# Patient Record
Sex: Female | Born: 1995
Health system: Southern US, Community
[De-identification: ages and names within clinical notes are randomized; demographics above are authoritative.]

## PROBLEM LIST (undated history)

## (undated) DIAGNOSIS — N760 Acute vaginitis: Secondary | ICD-10-CM

## (undated) DIAGNOSIS — B9689 Other specified bacterial agents as the cause of diseases classified elsewhere: Secondary | ICD-10-CM

## (undated) HISTORY — DX: Other specified bacterial agents as the cause of diseases classified elsewhere: B96.89

## (undated) HISTORY — DX: Other specified bacterial agents as the cause of diseases classified elsewhere: N76.0

---
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
## 2014-05-09 ENCOUNTER — Ambulatory Visit: Payer: Self-pay | Admitting: Family Medicine

## 2014-05-10 ENCOUNTER — Ambulatory Visit: Payer: Self-pay | Admitting: Family Medicine

## 2014-08-02 ENCOUNTER — Ambulatory Visit: Admit: 2014-08-02 | Disposition: A | Discharge status: Home / Self Care | Payer: Self-pay | Admitting: Family Medicine

## 2014-08-05 ENCOUNTER — Ambulatory Visit
Admit: 2014-08-05 | Disposition: A | Discharge status: Home / Self Care | Payer: Self-pay | Attending: Family Medicine | Admitting: Family Medicine

## 2015-05-16 DIAGNOSIS — B349 Viral infection, unspecified: Secondary | ICD-10-CM | POA: Diagnosis not present

## 2015-05-16 DIAGNOSIS — J312 Chronic pharyngitis: Secondary | ICD-10-CM | POA: Diagnosis not present

## 2015-09-25 ENCOUNTER — Ambulatory Visit (INDEPENDENT_AMBULATORY_CARE_PROVIDER_SITE_OTHER): Payer: BLUE CROSS/BLUE SHIELD | Admitting: Family Medicine

## 2015-09-25 ENCOUNTER — Encounter: Payer: Self-pay | Admitting: Family Medicine

## 2015-09-25 VITALS — BP 114/69 | HR 87 | Temp 98.6°F | Resp 16

## 2015-09-25 DIAGNOSIS — J029 Acute pharyngitis, unspecified: Secondary | ICD-10-CM | POA: Diagnosis not present

## 2015-09-25 LAB — POCT RAPID STREP A (OFFICE): RAPID STREP A SCREEN: NEGATIVE

## 2015-09-25 MED ORDER — AMOXICILLIN 500 MG PO CAPS: 500.0000 mg | ORAL_CAPSULE | Freq: Two times a day (BID) | ORAL | Status: DC

## 2015-09-25 NOTE — Progress Notes (Signed)
Patient ID: Billey GoslingShaylen Merriott, female   DOB: Jul 17, 1995, 20 y.o.   MRN: [id number]  Presents today with symptoms of sore throat for 1 day. She denies any fever or chills. She does have some postnasal drip. She denies any abdominal pain or fatigue. She is not taking any medications today.  ROS: Negative except mentioned above.  Vitals as on Epic.  GENERAL: NAD HEENT: mild pharyngeal erythema with minimal exudate on right tonsil, no erythema of TMs, no cervical LAD RESP: CTA B CARD: RRR ABD: +BS, NT NEURO: CN II-XII grossly intact   A/P: Pharyngitis- rapid strep test was negative, will treat patient with Amoxicillin, Ibuprofen, Claritin when necessary seek medical attention if symptoms persist or worsen as discussed. I advised the patient that if she has any trouble with physical activity later today she should tell her trainer and stop.

## 2015-12-21 ENCOUNTER — Ambulatory Visit (INDEPENDENT_AMBULATORY_CARE_PROVIDER_SITE_OTHER): Payer: BLUE CROSS/BLUE SHIELD | Admitting: Family Medicine

## 2015-12-21 VITALS — BP 125/76 | HR 93 | Temp 98.3°F | Resp 16

## 2015-12-21 DIAGNOSIS — J069 Acute upper respiratory infection, unspecified: Secondary | ICD-10-CM | POA: Diagnosis not present

## 2015-12-21 NOTE — Progress Notes (Signed)
Patient presents today with symptoms of nasal drainage, sore throat, cough. Patient did have a temperature 101 this morning. Patient states that she's had symptoms for 2 days. She denies any nausea, vomiting, diarrhea, abdominal pain. Last menstrual period 2 weeks ago. Patient take Mucinex and Tylenol earlier today.  ROS: Negative except mentioned above. Vitals as per Epic.  GENERAL: NAD HEENT: mild pharyngeal erythema, no exudate, no erythema of TMs, no cervical LAD RESP: CTA B CARD: RRR NEURO: CN II-XII grossly intact   A/P: URI - Rapid strep. negative , flu screen negative, rest, hydration, Tylenol/Ibuprofen when necessary, Claritin when necessary, Delsym when necessary, seek medical attention if symptoms persist or worsen as discussed. No classic today or athletic activity until afebrile without medication.

## 2016-01-01 ENCOUNTER — Ambulatory Visit (INDEPENDENT_AMBULATORY_CARE_PROVIDER_SITE_OTHER): Payer: BLUE CROSS/BLUE SHIELD | Admitting: Family Medicine

## 2016-01-01 VITALS — BP 127/59 | HR 66 | Temp 98.5°F | Resp 16

## 2016-01-01 DIAGNOSIS — R635 Abnormal weight gain: Secondary | ICD-10-CM | POA: Diagnosis not present

## 2016-01-01 DIAGNOSIS — J069 Acute upper respiratory infection, unspecified: Secondary | ICD-10-CM | POA: Diagnosis not present

## 2016-01-01 MED ORDER — AZITHROMYCIN 250 MG PO TABS: ORAL_TABLET | ORAL | 0 refills | Status: DC

## 2016-01-01 NOTE — Progress Notes (Signed)
Patient presents today with symptoms of mild productive cough. Patient states that she feels better from when she was seen last time but now has colored mucus. She often times is coughing when she is running. She denies any chest pain or increased shortness of breath. She denies any fever or chills or any nausea or vomiting. She is not taking any other medications at this time. Patient also would like to have her thyroid checked. Patient states that she has had weight gain over the postseason and is trying to lose weight but finds it difficult. We have had her see nutrition in the past. She states her greatest weakness is white bread. She denies any thyroid disease in her family. She does believe that her mother is prediabetic.  ROS: Negative except mentioned above.  Vitals as per Epic.  GENERAL: NAD HEENT: no pharyngeal erythema, no exudate, no erythema of TMs, no cervical LAD, no thyroid mass appreciated RESP: CTA B CARD: RRR NEURO: CN II-XII grossly intact   A/P: URI, weight gain - will prescribe Z-Pak, Delsym when necessary, albuterol inhaler when necessary, follow-up when necessary. We will also test TSH and free T4 and A1c. I would like the patient to also see the nutritionist. Patient has agreed to do this. F/U in a few weeks.

## 2016-01-02 LAB — TSH: TSH: 2.26 u[IU]/mL (ref 0.450–4.500)

## 2016-01-02 LAB — HEMOGLOBIN A1C
Est. average glucose Bld gHb Est-mCnc: 103 mg/dL
HEMOGLOBIN A1C: 5.2 % (ref 4.8–5.6)

## 2016-01-02 LAB — T4, FREE: Free T4: 1.4 ng/dL (ref 0.82–1.77)

## 2016-02-02 ENCOUNTER — Ambulatory Visit: Payer: BLUE CROSS/BLUE SHIELD | Admitting: Family Medicine

## 2016-03-25 ENCOUNTER — Ambulatory Visit
Admission: RE | Admit: 2016-03-25 | Discharge: 2016-03-25 | Disposition: A | Discharge status: Home / Self Care | Payer: BLUE CROSS/BLUE SHIELD | Source: Ambulatory Visit | Attending: Family Medicine | Admitting: Family Medicine

## 2016-03-25 ENCOUNTER — Encounter: Payer: Self-pay | Admitting: Family Medicine

## 2016-03-25 ENCOUNTER — Ambulatory Visit (INDEPENDENT_AMBULATORY_CARE_PROVIDER_SITE_OTHER): Payer: BLUE CROSS/BLUE SHIELD | Admitting: Family Medicine

## 2016-03-25 DIAGNOSIS — S59909A Unspecified injury of unspecified elbow, initial encounter: Secondary | ICD-10-CM

## 2016-03-25 DIAGNOSIS — S59902A Unspecified injury of left elbow, initial encounter: Secondary | ICD-10-CM | POA: Diagnosis not present

## 2016-03-25 DIAGNOSIS — M25522 Pain in left elbow: Secondary | ICD-10-CM | POA: Insufficient documentation

## 2016-03-25 DIAGNOSIS — G8929 Other chronic pain: Secondary | ICD-10-CM | POA: Diagnosis not present

## 2016-03-25 MED ORDER — NAPROXEN 500 MG PO TABS: 500.0000 mg | ORAL_TABLET | Freq: Two times a day (BID) | ORAL | 0 refills | Status: DC

## 2016-03-25 NOTE — Progress Notes (Signed)
Patient presents today with symptoms of left elbow pain with some swelling. Patient states that during the basketball game on Saturday she had a valgus injury towards the end of the game. She denies feeling a pop. She did have immediate pain after the injury. She was able to play the rest of the game. Later that evening she informed her trainer about her symptoms. She has some difficulty with flexion but most discomfort with extension. She denies any bruising of the area or any tingling or numbness. She denies any feeling of instability of the elbow. She denies any previous injury to the elbow in the past. It is her nondominant arm. She has been using Game Ready the last 2 days and has had improvement in symptoms.  ROS: Negative except mentioned above. Vitals as per Epic.  GENERAL: NAD MSK: Left elbow- there is no ecchymosis of the area appreciated, 10-15 of limitation with full extension and flexion, there is some mild tenderness of the proximal flexor muscle group, there is mild discomfort with valgus stress, no instability appreciated, no tenderness on lateral or medial epicondyle, discomfort with milking maneuver, NV intact NEURO: CN II-XII grossly intact   A/P: Left medial elbow injury - will do x-rays to look for effusion or soft tissue swelling, no bony injury likely, discussed possibility of UCL strain, continue rest, Game Ready, Naprosyn when necessary, also discussed possibly wearing brace, will decide on further imaging in a few days if symptoms are not improving. Follow up with orthopedics if needed. Discussed plan with patient and trainer.

## 2016-06-19 ENCOUNTER — Other Ambulatory Visit: Payer: Self-pay | Admitting: Family Medicine

## 2016-06-19 MED ORDER — BENZONATATE 100 MG PO CAPS: 100.0000 mg | ORAL_CAPSULE | Freq: Two times a day (BID) | ORAL | 0 refills | Status: DC | PRN

## 2016-06-19 MED ORDER — AZITHROMYCIN 250 MG PO TABS: ORAL_TABLET | ORAL | 0 refills | Status: DC

## 2016-08-07 DIAGNOSIS — H5213 Myopia, bilateral: Secondary | ICD-10-CM | POA: Diagnosis not present

## 2016-09-26 DIAGNOSIS — B349 Viral infection, unspecified: Secondary | ICD-10-CM | POA: Diagnosis not present

## 2016-09-26 DIAGNOSIS — K529 Noninfective gastroenteritis and colitis, unspecified: Secondary | ICD-10-CM | POA: Diagnosis not present

## 2016-10-15 ENCOUNTER — Ambulatory Visit (INDEPENDENT_AMBULATORY_CARE_PROVIDER_SITE_OTHER): Payer: Self-pay | Admitting: Family Medicine

## 2016-10-15 DIAGNOSIS — Z025 Encounter for examination for participation in sport: Secondary | ICD-10-CM

## 2016-10-15 NOTE — Progress Notes (Signed)
Comes in for PPE [place]. Form filled out and copied and will be scanned in. Cipro script given in case patient has moderate to severe diarrhea while in [place]. Will establish with primary care physician when she gets back.

## 2017-09-13 ENCOUNTER — Emergency Department
Admission: EM | Admit: 2017-09-13 | Discharge: 2017-09-13 | Disposition: A | Discharge status: Home / Self Care | Payer: BLUE CROSS/BLUE SHIELD | Attending: Emergency Medicine | Admitting: Emergency Medicine

## 2017-09-13 ENCOUNTER — Encounter: Payer: Self-pay | Admitting: Emergency Medicine

## 2017-09-13 ENCOUNTER — Emergency Department: Payer: BLUE CROSS/BLUE SHIELD

## 2017-09-13 ENCOUNTER — Other Ambulatory Visit: Payer: Self-pay

## 2017-09-13 DIAGNOSIS — R1030 Lower abdominal pain, unspecified: Secondary | ICD-10-CM | POA: Diagnosis not present

## 2017-09-13 DIAGNOSIS — Z79899 Other long term (current) drug therapy: Secondary | ICD-10-CM | POA: Insufficient documentation

## 2017-09-13 DIAGNOSIS — N83202 Unspecified ovarian cyst, left side: Secondary | ICD-10-CM

## 2017-09-13 DIAGNOSIS — B9689 Other specified bacterial agents as the cause of diseases classified elsewhere: Secondary | ICD-10-CM | POA: Diagnosis not present

## 2017-09-13 DIAGNOSIS — R1031 Right lower quadrant pain: Secondary | ICD-10-CM

## 2017-09-13 DIAGNOSIS — N83201 Unspecified ovarian cyst, right side: Secondary | ICD-10-CM

## 2017-09-13 DIAGNOSIS — N760 Acute vaginitis: Secondary | ICD-10-CM | POA: Insufficient documentation

## 2017-09-13 LAB — URINALYSIS, COMPLETE (UACMP) WITH MICROSCOPIC
BILIRUBIN URINE: NEGATIVE
GLUCOSE, UA: NEGATIVE mg/dL
Hgb urine dipstick: NEGATIVE
KETONES UR: NEGATIVE mg/dL
Leukocytes, UA: NEGATIVE
NITRITE: NEGATIVE
PROTEIN: 30 mg/dL — AB
Specific Gravity, Urine: 1.029 (ref 1.005–1.030)
pH: 7 (ref 5.0–8.0)

## 2017-09-13 LAB — WET PREP, GENITAL
Sperm: NONE SEEN
Trich, Wet Prep: NONE SEEN
Yeast Wet Prep HPF POC: NONE SEEN

## 2017-09-13 LAB — COMPREHENSIVE METABOLIC PANEL
ALBUMIN: 4.7 g/dL (ref 3.5–5.0)
ALK PHOS: 41 U/L (ref 38–126)
ALT: 15 U/L (ref 14–54)
ANION GAP: 9 (ref 5–15)
AST: 23 U/L (ref 15–41)
BUN: 12 mg/dL (ref 6–20)
CALCIUM: 8.8 mg/dL — AB (ref 8.9–10.3)
CO2: 22 mmol/L (ref 22–32)
Chloride: 103 mmol/L (ref 101–111)
Creatinine, Ser: 0.91 mg/dL (ref 0.44–1.00)
GFR calc non Af Amer: >60 mL/min (ref >60)
GLUCOSE: 97 mg/dL (ref 65–99)
Potassium: 3.7 mmol/L (ref 3.5–5.1)
SODIUM: 134 mmol/L — AB (ref 135–145)
TOTAL PROTEIN: 7.8 g/dL (ref 6.5–8.1)
Total Bilirubin: 1.4 mg/dL — ABNORMAL HIGH (ref 0.3–1.2)

## 2017-09-13 LAB — CBC
HCT: 40.2 % (ref 35.0–47.0)
HEMOGLOBIN: 13.7 g/dL (ref 12.0–16.0)
MCH: 28.9 pg (ref 26.0–34.0)
MCHC: 34.1 g/dL (ref 32.0–36.0)
MCV: 84.8 fL (ref 80.0–100.0)
Platelets: 238 10*3/uL (ref 150–440)
RBC: 4.74 MIL/uL (ref 3.80–5.20)
RDW: 13.3 % (ref 11.5–14.5)
WBC: 11.7 10*3/uL — ABNORMAL HIGH (ref 3.6–11.0)

## 2017-09-13 LAB — POCT PREGNANCY, URINE: PREG TEST UR: NEGATIVE

## 2017-09-13 LAB — CHLAMYDIA/NGC RT PCR (ARMC ONLY)
CHLAMYDIA TR: NOT DETECTED
N GONORRHOEAE: NOT DETECTED

## 2017-09-13 LAB — LIPASE, BLOOD: Lipase: 29 U/L (ref 11–51)

## 2017-09-13 MED ORDER — MORPHINE SULFATE (PF) 2 MG/ML IV SOLN
2.0000 mg | Freq: Once | INTRAVENOUS | Status: AC
  Administered 2017-09-13: 2 mg via INTRAVENOUS
  Filled 2017-09-13: qty 1

## 2017-09-13 MED ORDER — METRONIDAZOLE 500 MG PO TABS: 500.0000 mg | ORAL_TABLET | Freq: Three times a day (TID) | ORAL | 0 refills | Status: DC

## 2017-09-13 MED ORDER — IOPAMIDOL (ISOVUE-300) INJECTION 61%
100.0000 mL | Freq: Once | INTRAVENOUS | Status: AC | PRN
  Administered 2017-09-13: 100 mL via INTRAVENOUS

## 2017-09-13 MED ORDER — SODIUM CHLORIDE 0.9 % IV BOLUS
1000.0000 mL | Freq: Once | INTRAVENOUS | Status: AC
  Administered 2017-09-13: 1000 mL via INTRAVENOUS

## 2017-09-13 MED ORDER — KETOROLAC TROMETHAMINE 30 MG/ML IJ SOLN
15.0000 mg | Freq: Once | INTRAMUSCULAR | Status: AC
  Administered 2017-09-13: 15 mg via INTRAVENOUS
  Filled 2017-09-13: qty 1

## 2017-09-13 MED ORDER — ONDANSETRON HCL 4 MG/2ML IJ SOLN
4.0000 mg | Freq: Once | INTRAMUSCULAR | Status: AC
  Administered 2017-09-13: 4 mg via INTRAVENOUS
  Filled 2017-09-13: qty 2

## 2017-09-13 MED ORDER — IOPAMIDOL (ISOVUE-300) INJECTION 61%
30.0000 mL | Freq: Once | INTRAVENOUS | Status: AC
  Administered 2017-09-13: 30 mL via ORAL

## 2017-09-13 MED ORDER — ONDANSETRON 4 MG PO TBDP: 4.0000 mg | ORAL_TABLET | Freq: Four times a day (QID) | ORAL | 0 refills | Status: DC | PRN

## 2017-09-13 NOTE — ED Triage Notes (Signed)
Arrives C/O lower abdominal pain that radiates toward low back.  Also c/o N/V.  States symptom onset was 1000.  AAOx3.  Skin warm and dry. NAD

## 2017-09-13 NOTE — Discharge Instructions (Addendum)
You were seen in the emergency room for abdominal pain. It is important that you follow up closely with your primary care doctor in the next couple of days. ° ° °Please return to the emergency room right away if you are to develop a fever, severe nausea, your pain becomes severe or worsens, you are unable to keep food down, begin vomiting any dark or bloody fluid, you develop any dark or bloody stools, feel dehydrated, or other new concerns or symptoms arise. ° ° °

## 2017-09-13 NOTE — ED Provider Notes (Signed)
-----------------------------------------   9:41 PM on 09/13/2017 -----------------------------------------  IMPRESSION: No acute abnormality identified in the abdomen.  No bowel obstruction. The appendix is not seen but no inflammation is noted around cecum.  Small amount of free fluid is identified in the pelvis. Bilateral ovarian cysts, right greater than left, probably follicular cysts.  Reviewed CT results, there is a roughly 3.6 x 7 cm probably right ovarian complicated cyst noted on CT read.  Based upon this and the patient's pain in the right lower quadrant without evidence of appendicitis on CT scan I am elevated concern for ovarian torsion.  Even with a normal ovarian ultrasound, if negative for torsion would still advise OB consult given the nature of pain and location of her symptoms.  I have placed a consult to ob/gynecology Dr. Jean RosenthalJackson via phone call at 950p  Updated Scotty CourtStafford on plan for OB/GYN consultation concerning right lower quadrant pain.   1010pm, Dr. Jean RosenthalJackson in ED seeing patient.    Sharyn CreamerQuale, Mark, MD 09/13/17 2210

## 2017-09-13 NOTE — Consult Note (Signed)
GYNECOLOGY CONSULT NOTE  GYN Consultation  Attending Provider: Sharyn Creamer, MD   Mikela Senn [phone] 09/13/2017 11:13 PM    Reason for Consultation:   Julia Rogers is a 22 y.o. G0 female seen at the request of Dr. Fanny Bien for evaluation of right lower quadrant pain, concern for ovarian torsion.    History of Present Ilness:   22 y.o. G0 female who presents with acute-onset right lower quadrant pain that started this morning at about 9 am.  The pain has been sharp and intermittent. The pain is 8/10 at its worst and 5/10 when not as bad. The pain radiates through to her back and down her posterior thigh.  Lying still makes the pain better.  Moving around makes it worse.  She associates nausea and emesis with her pain. She has not been able to keep any food down at all today. She denies fevers, chills, chest pain, shortness of breath, diarrhea, constipation, urinary systems, vaginal symptoms.  LMP was about two weeks ago. She has not been sexually active in about 9 months and is not using anything for contraception or menstrual manipulation.  Denies history of STDs.   Past Medical History: denies  Past Surgical History: no prior surgeries  Allergies: NKDA  Medications: denies any current medication   Obstetric History: She is a G0  Gynecologic History:   Menarche age: 53 Patient's last menstrual period was 08/30/2017. Cycle Hx: regular every 28 days without intermenstrual spotting She denies STDs Contraception: denies   Social History:  She  reports that she has never smoked. She does not have any smokeless tobacco history on file. She reports that she does not drink alcohol.  Family History:  family history includes Cancer in her maternal grandmother; Diabetes in her mother and paternal grandmother; Hypertension in her paternal grandmother.   Review of Systems  Constitutional: Negative.   HENT: Negative.   Eyes: Negative.   Respiratory: Negative.   Cardiovascular:  Negative.   Gastrointestinal: Positive for abdominal pain, nausea and vomiting. Negative for blood in stool, constipation, diarrhea, heartburn and melena.  Genitourinary: Negative.   Musculoskeletal: Negative.   Skin: Negative.   Neurological: Negative.   Endo/Heme/Allergies: Negative.   Psychiatric/Behavioral: Negative.      Objective    BP 124/73   Pulse 86   Temp 98 F (36.7 C) (Oral)   Resp 18   Ht 5\' 8"  (1.727 m)   Wt 205 lb (93 kg)   LMP 08/30/2017   SpO2 100%   BMI 31.17 kg/m  Physical Exam  Physical Exam  Constitutional: She is oriented to person, place, and time. She appears well-developed and well-nourished. She does not appear ill. No distress.  HENT:  Head: Normocephalic and atraumatic.  Eyes: Conjunctivae and EOM are normal. No scleral icterus.  Neck: Normal range of motion. Neck supple. No thyromegaly present.  Cardiovascular: Normal rate and regular rhythm.  Pulmonary/Chest: Effort normal and breath sounds normal. No respiratory distress. She has no wheezes.  Abdominal: Soft. Normal appearance and bowel sounds are normal. She exhibits no distension. There is tenderness (mild RLQ) in the right lower quadrant. There is no rigidity, no rebound, no guarding, no CVA tenderness, no tenderness at McBurney's point and negative Murphy's sign.  Musculoskeletal: Normal range of motion. She exhibits no edema.  Lymphadenopathy:    She has no cervical adenopathy.  Neurological: She is alert and oriented to person, place, and time. No cranial nerve deficit.  Skin: Skin is warm and dry. She is  not diaphoretic. No pallor.  Psychiatric: She has a normal mood and affect. Her behavior is normal. Judgment normal.     Laboratory Results:   Lab Results  Component Value Date   WBC 11.7 (H) 09/13/2017   RBC 4.74 09/13/2017   HGB 13.7 09/13/2017   HCT 40.2 09/13/2017   PLT 238 09/13/2017   NA 134 (L) 09/13/2017   K 3.7 09/13/2017   CREATININE 0.91 09/13/2017   Lab Results    Component Value Date   PREGTESTUR NEGATIVE 09/13/2017   [place] Transvaginal Non-ob  Result Date: 09/13/2017 CLINICAL DATA:  Initial evaluation for acute sharp right lower quadrant pain. EXAM: TRANSABDOMINAL AND TRANSVAGINAL ULTRASOUND OF PELVIS DOPPLER ULTRASOUND OF OVARIES TECHNIQUE: Both transabdominal and transvaginal ultrasound examinations of the pelvis were performed. Transabdominal technique was performed for global imaging of the pelvis including uterus, ovaries, adnexal regions, and pelvic cul-de-sac. It was necessary to proceed with endovaginal exam following the transabdominal exam to visualize the uterus, endometrium, and ovaries. Color and duplex Doppler ultrasound was utilized to evaluate blood flow to the ovaries. COMPARISON:  Prior CT from earlier the same day. FINDINGS: Uterus Measurements: 7.5 x 4.7 x 5.9 cm. No fibroids or other mass visualized. Endometrium Thickness: 13.3 mm.  No focal abnormality visualized. Right ovary Measurements: 5.1 x 2.7 x 4.0 cm.  2.0 cm dominant follicle noted. Left ovary Measurements: 3.8 x 2.5 x 2.7 cm. Normal appearance/no adnexal mass. Pulsed Doppler evaluation of both ovaries demonstrates normal low-resistance arterial and venous waveforms. Other findings No abnormal free fluid. IMPRESSION: Normal pelvic ultrasound. No acute abnormality identified. No evidence for torsion. Electronically Signed   By: Rise MuBenjamin  McClintock M.D.   On: 09/13/2017 22:24   [place] Pelvis Complete  Result Date: 09/13/2017 CLINICAL DATA:  Initial evaluation for acute sharp right lower quadrant pain. EXAM: TRANSABDOMINAL AND TRANSVAGINAL ULTRASOUND OF PELVIS DOPPLER ULTRASOUND OF OVARIES TECHNIQUE: Both transabdominal and transvaginal ultrasound examinations of the pelvis were performed. Transabdominal technique was performed for global imaging of the pelvis including uterus, ovaries, adnexal regions, and pelvic cul-de-sac. It was necessary to proceed with endovaginal exam following the  transabdominal exam to visualize the uterus, endometrium, and ovaries. Color and duplex Doppler ultrasound was utilized to evaluate blood flow to the ovaries. COMPARISON:  Prior CT from earlier the same day. FINDINGS: Uterus Measurements: 7.5 x 4.7 x 5.9 cm. No fibroids or other mass visualized. Endometrium Thickness: 13.3 mm.  No focal abnormality visualized. Right ovary Measurements: 5.1 x 2.7 x 4.0 cm.  2.0 cm dominant follicle noted. Left ovary Measurements: 3.8 x 2.5 x 2.7 cm. Normal appearance/no adnexal mass. Pulsed Doppler evaluation of both ovaries demonstrates normal low-resistance arterial and venous waveforms. Other findings No abnormal free fluid. IMPRESSION: Normal pelvic ultrasound. No acute abnormality identified. No evidence for torsion. Electronically Signed   By: Rise MuBenjamin  McClintock M.D.   On: 09/13/2017 22:24   Ct Abdomen Pelvis W Contrast  Result Date: 09/13/2017 CLINICAL DATA:  Lower abdomen pain. EXAM: CT ABDOMEN AND PELVIS WITH CONTRAST TECHNIQUE: Multidetector CT imaging of the abdomen and pelvis was performed using the standard protocol following bolus administration of intravenous contrast. CONTRAST:  100mL ISOVUE-300 IOPAMIDOL (ISOVUE-300) INJECTION 61% COMPARISON:  None. FINDINGS: Lower chest: No acute abnormality. Hepatobiliary: Diffuse low density of the liver. There is no focal liver mass. The gallbladder and biliary tree are normal. Pancreas: Unremarkable. No pancreatic ductal dilatation or surrounding inflammatory changes. Spleen: Normal in size without focal abnormality. Adrenals/Urinary Tract: Adrenal glands are unremarkable. Kidneys are  normal, without renal calculi, focal lesion, or hydronephrosis. Bladder is unremarkable. Stomach/Bowel: There is a small hiatal hernia. The stomach is otherwise normal. There is no small bowel obstruction. The appendix is not seen but no inflammation is noted around cecum. Colon is normal. Vascular/Lymphatic: No significant vascular findings  are present. No enlarged abdominal or pelvic lymph nodes. Reproductive: Mild free fluid is identified in the pelvis. Complicated cystic area is identified in the right adnexa measuring 3.6 x 7.6 cm probably right ovary with multiple cysts. The uterus is normal. Smaller cysts are identified in the left ovary. Other: None. Musculoskeletal: No acute or significant osseous findings. IMPRESSION: No acute abnormality identified in the abdomen. No bowel obstruction. The appendix is not seen but no inflammation is noted around cecum. Small amount of free fluid is identified in the pelvis. Bilateral ovarian cysts, right greater than left, probably follicular cysts. Electronically Signed   By: Sherian Rein M.D.   On: 09/13/2017 21:31   [place] Art/ven Flow Abd Pelv Doppler  Result Date: 09/13/2017 CLINICAL DATA:  Initial evaluation for acute sharp right lower quadrant pain. EXAM: TRANSABDOMINAL AND TRANSVAGINAL ULTRASOUND OF PELVIS DOPPLER ULTRASOUND OF OVARIES TECHNIQUE: Both transabdominal and transvaginal ultrasound examinations of the pelvis were performed. Transabdominal technique was performed for global imaging of the pelvis including uterus, ovaries, adnexal regions, and pelvic cul-de-sac. It was necessary to proceed with endovaginal exam following the transabdominal exam to visualize the uterus, endometrium, and ovaries. Color and duplex Doppler ultrasound was utilized to evaluate blood flow to the ovaries. COMPARISON:  Prior CT from earlier the same day. FINDINGS: Uterus Measurements: 7.5 x 4.7 x 5.9 cm. No fibroids or other mass visualized. Endometrium Thickness: 13.3 mm.  No focal abnormality visualized. Right ovary Measurements: 5.1 x 2.7 x 4.0 cm.  2.0 cm dominant follicle noted. Left ovary Measurements: 3.8 x 2.5 x 2.7 cm. Normal appearance/no adnexal mass. Pulsed Doppler evaluation of both ovaries demonstrates normal low-resistance arterial and venous waveforms. Other findings No abnormal free fluid.  IMPRESSION: Normal pelvic ultrasound. No acute abnormality identified. No evidence for torsion. Electronically Signed   By: Rise Mu M.D.   On: 09/13/2017 22:24    Assessment & Recommendations   Lashawna Poche is a 22 y.o. G0 female with right lower quadrant pain being seen in consultation.  Concern for ovarian torsion. I had a long discussion with the patient regarding the risks of ovarian torsion, including loss of an ovary and other possible sequelae.  Given symptom constellation and ultrasound/CT findings, I recommended a diagnostic laparoscopy.  After much discussion of pros and cons along with risks and benefits, the patient has elected to hold off on surgery for now. I offered to admit her for observation, which she also declined. She states that she is feeling better now and elects to go home. I was clear with her regarding her risks of not having surgery and welcomed her to come back should any concerning symptoms recur.  Her parents were present with her and all questions from her and her parents were answered.   Thomasene Mohair, MD 09/13/2017 11:13 PM

## 2017-09-13 NOTE — ED Triage Notes (Signed)
Lower R abdominal pain began this am.

## 2017-09-13 NOTE — ED Provider Notes (Signed)
 -----------------------------------------   11:28 PM on 09/13/2017 -----------------------------------------  Discussed with Dr. Jean RosenthalJackson after his evaluation of the patient.  He notes that he recommended diagnostic laparoscopy due to concern for occult torsion, but patient refused.  He also offered observation for symptom monitoring and serial exams but she refused and wants to go home.  She is feeling much better after Toradol and a low dose of morphine.  I will discharge her with prescription for Flagyl for treatment of bacterial vaginosis.  Return precautions.  Overall imaging does not reveal any definitive serious concerns, and she has medical decision-making capacity.   Sharman CheekStafford, Garek Schuneman, MD 09/13/17 2330

## 2017-09-13 NOTE — ED Provider Notes (Addendum)
[facility] Department Provider Note   ____________________________________________   First MD Initiated Contact with Patient 09/13/17 1939     (approximate)  I have reviewed the triage vital signs and the nursing notes.   HISTORY  Chief Complaint Abdominal Pain    HPI Julia Rogers is a 22 y.o. female presents for evaluation of right lower abdominal pain.  Patient reports that roughly 9 or 10 AM this morning is been experiencing a severe pain in the right lower abdomen that radiates slightly towards her right lower pelvis.  Pain is hard to describe somewhat like a pressure or sharpness.  Not associate any vaginal bleeding or discharge.  Last menstrual cycle 2 weeks ago.  Is not been sexually active for about 9 months.  Denies any history of STDs.  Pain is somewhat intermittent in nature, seems to come and go when it does the pain is severe in the lower pelvis associated with vomiting.  No fevers or chills.  Does report nausea is vomited several times due to the pain today.  No history of any ovarian cysts.  History reviewed. No pertinent past medical history.  There are no active problems to display for this patient.   History reviewed. No pertinent surgical history.  Prior to Admission medications   Medication Sig Start Date End Date Taking? Authorizing Provider  azithromycin (ZITHROMAX Z-PAK) 250 MG tablet Use as directed on box for 5 days. 06/19/16   Jolene Provost, MD  benzonatate (TESSALON) 100 MG capsule Take 1 capsule (100 mg total) by mouth 2 (two) times daily as needed for cough. 06/19/16   Jolene Provost, MD  metroNIDAZOLE (FLAGYL) 500 MG tablet Take 1 tablet (500 mg total) by mouth 3 (three) times daily. 09/13/17   Sharyn Creamer, MD  naproxen (NAPROSYN) 500 MG tablet Take 1 tablet (500 mg total) by mouth 2 (two) times daily with a meal. 03/25/16   Jolene Provost, MD  ondansetron (ZOFRAN ODT) 4 MG disintegrating tablet Take 1 tablet  (4 mg total) by mouth every 6 (six) hours as needed for nausea or vomiting. 09/13/17   Sharyn Creamer, MD    Allergies Patient has no known allergies.  Family History  Problem Relation Age of Onset  . Diabetes Mother   . Cancer Maternal Grandmother   . Hypertension Paternal Grandmother   . Diabetes Paternal Grandmother     Social History Social History   Tobacco Use  . Smoking status: Never Smoker  Substance Use Topics  . Alcohol use: No  . Drug use: Not on file    Review of Systems Constitutional: No fever/chills Eyes: No visual changes. ENT: No sore throat. Cardiovascular: Denies chest pain. Respiratory: Denies shortness of breath. Gastrointestinal:  No diarrhea.  No constipation. Genitourinary: Negative for dysuria. Musculoskeletal: Negative for back pain. Skin: Negative for rash. Neurological: Negative for headaches, focal weakness or numbness.    ____________________________________________   PHYSICAL EXAM:  VITAL SIGNS: ED Triage Vitals  Enc Vitals Group     BP 09/13/17 1522 124/73     Pulse Rate 09/13/17 1522 86     Resp 09/13/17 1522 18     Temp 09/13/17 1522 98 F (36.7 C)     Temp Source 09/13/17 1522 Oral     SpO2 09/13/17 1522 100 %     Weight 09/13/17 1523 205 lb (93 kg)     Height 09/13/17 1523 5\' 8"  (1.727 m)     Head Circumference --  Peak Flow --      Pain Score 09/13/17 1523 8     Pain Loc --      Pain Edu? --      Excl. in GC? --     Constitutional: Alert and oriented. Well appearing and in no acute distress. Eyes: Conjunctivae are normal. Head: Atraumatic. Nose: No congestion/rhinnorhea. Mouth/Throat: Mucous membranes are moist. Neck: No stridor.   Cardiovascular: Normal rate, regular rhythm. Grossly normal heart sounds.  Good peripheral circulation. Respiratory: Normal respiratory effort.  No retractions. Lungs CTAB. Gastrointestinal: Soft and nontender on the left side, but the patient has notable tenderness in the right  lower quadrant with some slight rebound tenderness.  The pain is in the area of McBurney's point. No distention. Pelvic exam performed with tech Brittney.  External exam is normal.  Internal exam demonstrates there is scant somewhat thickened and slightly greenish discharge.  No cervical motion tenderness.  The patient does report some tenderness increasing in the right lower quadrant to examination in the right adnexa. Musculoskeletal: No lower extremity tenderness nor edema. Neurologic:  Normal speech and language. No gross focal neurologic deficits are appreciated.  Skin:  Skin is warm, dry and intact. No rash noted. Psychiatric: Mood and affect are normal. Speech and behavior are normal.  ____________________________________________   LABS (all labs ordered are listed, but only abnormal results are displayed)  Labs Reviewed  WET PREP, GENITAL - Abnormal; Notable for the following components:      Result Value   Clue Cells Wet Prep HPF POC PRESENT (*)    WBC, Wet Prep HPF POC MODERATE (*)    All other components within normal limits  COMPREHENSIVE METABOLIC PANEL - Abnormal; Notable for the following components:   Sodium 134 (*)    Calcium 8.8 (*)    Total Bilirubin 1.4 (*)    All other components within normal limits  CBC - Abnormal; Notable for the following components:   WBC 11.7 (*)    All other components within normal limits  URINALYSIS, COMPLETE (UACMP) WITH MICROSCOPIC - Abnormal; Notable for the following components:   Color, Urine YELLOW (*)    APPearance CLOUDY (*)    Protein, ur 30 (*)    Bacteria, UA RARE (*)    All other components within normal limits  URINE CULTURE  CHLAMYDIA/NGC RT PCR ([facility] ONLY)  LIPASE, BLOOD  POCT PREGNANCY, URINE  POC URINE PREG, ED   ____________________________________________  EKG   ____________________________________________  RADIOLOGY  CT abdomen pelvis and ultrasound to exclude ovarian torsion pending at the  time of signout to Dr. Scotty CourtStafford ____________________________________________   PROCEDURES  Procedure(s) performed: None  Procedures  Critical Care performed: No  ____________________________________________   INITIAL IMPRESSION / ASSESSMENT AND PLAN / ED COURSE  Pertinent labs & imaging results that were available during my care of the patient were reviewed by me and considered in my medical decision making (see chart for details).  Differential diagnosis includes but is not limited to, abdominal perforation, aortic dissection, cholecystitis, appendicitis, diverticulitis, colitis, esophagitis/gastritis, kidney stone, pyelonephritis, urinary tract infection, aortic aneurysm. All are considered in decision and treatment plan. Based upon the patient's presentation and risk factors, concerned about right lower quadrant pathology the etiology is not clear but high in her differentials exclusion of appendicitis, torsion, consideration for hemorrhagic cyst.  Does not appear to have any obvious infectious symptoms or exam suggests she may have an element of bacterial vaginosis.  Does not appear to be at high  risk for an STD without a sexually active for several months.   Clinical Course as of Sep 13 2136  Sat Sep 13, 2017  2133 CT ABDOMEN PELVIS W CONTRAST [MQ]  2133 Ongoing care and disposition assigned to Dr. Scotty Court. Dr. Scotty Court to follow-up on CT abdomen pelvis and ultrasound.  Plan to exclude right lower quadrant pathology including appendicitis, ovarian torsion, hemorrhagic cyst, and other acute pathology.  Follow-up on ordered studies and reassessment.   [MQ]    Clinical Course User Index [MQ] Sharyn Creamer, MD   After receiving morphine and Toradol the patient reports her pain is much better.  She will be going shortly for ultrasound and CT to further evaluate.  ____________________________________________   FINAL CLINICAL IMPRESSION(S) / ED DIAGNOSES  Final diagnoses:    Bacterial vaginitis  Lower abdominal pain      NEW MEDICATIONS STARTED DURING THIS VISIT:  New Prescriptions   METRONIDAZOLE (FLAGYL) 500 MG TABLET    Take 1 tablet (500 mg total) by mouth 3 (three) times daily.   ONDANSETRON (ZOFRAN ODT) 4 MG DISINTEGRATING TABLET    Take 1 tablet (4 mg total) by mouth every 6 (six) hours as needed for nausea or vomiting.     Note:  This document was prepared using Dragon voice recognition software and may include unintentional dictation errors.     Sharyn Creamer, MD 09/13/17 2139    Sharyn Creamer, MD 09/13/17 2200

## 2017-09-15 LAB — URINE CULTURE
CULTURE: NO GROWTH
SPECIAL REQUESTS: NORMAL

## 2017-10-23 ENCOUNTER — Ambulatory Visit (INDEPENDENT_AMBULATORY_CARE_PROVIDER_SITE_OTHER): Payer: BLUE CROSS/BLUE SHIELD | Admitting: Obstetrics and Gynecology

## 2017-10-23 ENCOUNTER — Encounter: Payer: Self-pay | Admitting: Obstetrics and Gynecology

## 2017-10-23 ENCOUNTER — Ambulatory Visit: Payer: Self-pay | Admitting: Obstetrics and Gynecology

## 2017-10-23 VITALS — BP 110/70 | HR 88 | Ht 69.0 in | Wt 222.0 lb

## 2017-10-23 DIAGNOSIS — N83201 Unspecified ovarian cyst, right side: Secondary | ICD-10-CM

## 2017-10-23 DIAGNOSIS — N940 Mittelschmerz: Secondary | ICD-10-CM | POA: Diagnosis not present

## 2017-10-23 DIAGNOSIS — R102 Pelvic and perineal pain: Secondary | ICD-10-CM | POA: Diagnosis not present

## 2017-10-23 NOTE — Progress Notes (Signed)
Patient ID: Julia Rogers, female   DOB: June 25, 1995, 22 y.o.   MRN: [id number]  Reason for Consult: ER follow up (Possible Right ovarian cyst)   Referred by Jolene Provost, MD  Subjective:     HPI:  Julia Rogers is a 22 y.o. female she is following up from the ER today.  She was seen in the ER on 09/13/2017 by Dr. Jean Rosenthal.  She was offered a diagnostic laparoscopy for possible ovarian torsion related to severe right-sided pelvic pain.  The patient had a normal ultrasound which showed a 2 cm right ovarian cyst fluid was detected in both ovaries.  The patient elected not to have surgery.  Her pain was controlled with Toradol and morphine.  Since that ER visit she reports that she has had no further pelvic pain.  She reports that the pain is better the next day.  She is present at today's visit with her mother Julia Rogers.    History reviewed. No pertinent past medical history. Family History  Problem Relation Age of Onset  . Diabetes Mother   . Cancer Maternal Grandmother   . Hypertension Paternal Grandmother   . Diabetes Paternal Grandmother    History reviewed. No pertinent surgical history.  Short Social History:  Social History   Tobacco Use  . Smoking status: Never Smoker  . Smokeless tobacco: Never Used  Substance Use Topics  . Alcohol use: No    No Known Allergies  No current outpatient medications on file.   No current facility-administered medications for this visit.     Review of Systems  Constitutional: Negative for chills, fatigue, fever and unexpected weight change.  HENT: Negative for trouble swallowing.  Eyes: Negative for loss of vision.  Respiratory: Negative for cough, shortness of breath and wheezing.  Cardiovascular: Negative for chest pain, leg swelling, palpitations and syncope.  GI: Negative for abdominal pain, blood in stool, diarrhea, nausea and vomiting.  GU: Negative for difficulty urinating, dysuria, frequency and hematuria.  Musculoskeletal:  Negative for back pain, leg pain and joint pain.  Skin: Negative for rash.  Neurological: Negative for dizziness, headaches, light-headedness, numbness and seizures.  Psychiatric: Negative for behavioral problem, confusion, depressed mood and sleep disturbance.        Objective:  Objective   Vitals:   10/23/17 0807  BP: 110/70  Pulse: 88  Weight: 222 lb (100.7 kg)  Height: 5\' 9"  (1.753 m)   Body mass index is 32.78 kg/m.  Physical Exam  Constitutional: She is oriented to person, place, and time. She appears well-developed and well-nourished.  HENT:  Head: Normocephalic and atraumatic.  Eyes: Pupils are equal, round, and reactive to light. EOM are normal.  Cardiovascular: Normal rate and regular rhythm.  Pulmonary/Chest: Effort normal. No respiratory distress.  Neurological: She is alert and oriented to person, place, and time.  Skin: Skin is warm and dry.  Psychiatric: She has a normal mood and affect. Her behavior is normal. Judgment and thought content normal.  Nursing note and vitals reviewed.      Assessme she is doing things as he was 5 06/14/1878 pregnant rightnt/Plan:     22 yo following up for acute pelvic pain We discussed that if the pain were to return she could treat herself with 600 mg of ibuprofen every 6 hours.  Also discussed supportive care such as warm showers or baths and use of a heating pad.  If this pain becomes a recurring problem she could start a oral contraceptive pill for ovulation  suppression.  She declines Pap smear and sexual transmitted disease testing today. She would like to establish care with the practice.   More than 30 minutes were spent face to face with the patient in the room with more than 50% of the time spent providing counseling and discussing the plan of management.      Natale Milchhristanna R Schuman MD  [facility], [facility] 10/23/17 8:35 AM

## 2017-10-28 DIAGNOSIS — H5213 Myopia, bilateral: Secondary | ICD-10-CM | POA: Diagnosis not present

## 2017-12-26 DIAGNOSIS — H1031 Unspecified acute conjunctivitis, right eye: Secondary | ICD-10-CM | POA: Diagnosis not present

## 2018-01-09 DIAGNOSIS — H1089 Other conjunctivitis: Secondary | ICD-10-CM | POA: Diagnosis not present

## 2018-01-10 DIAGNOSIS — H16101 Unspecified superficial keratitis, right eye: Secondary | ICD-10-CM | POA: Diagnosis not present

## 2018-07-27 DIAGNOSIS — L255 Unspecified contact dermatitis due to plants, except food: Secondary | ICD-10-CM | POA: Diagnosis not present

## 2018-07-27 DIAGNOSIS — R21 Rash and other nonspecific skin eruption: Secondary | ICD-10-CM | POA: Diagnosis not present

## 2018-11-24 ENCOUNTER — Encounter: Payer: Self-pay | Admitting: Obstetrics and Gynecology

## 2018-11-24 ENCOUNTER — Ambulatory Visit (INDEPENDENT_AMBULATORY_CARE_PROVIDER_SITE_OTHER): Payer: BC Managed Care – PPO | Admitting: Obstetrics and Gynecology

## 2018-11-24 ENCOUNTER — Other Ambulatory Visit: Payer: Self-pay

## 2018-11-24 ENCOUNTER — Other Ambulatory Visit (HOSPITAL_COMMUNITY)
Admission: RE | Admit: 2018-11-24 | Discharge: 2018-11-24 | Disposition: A | Discharge status: Home / Self Care | Payer: BC Managed Care – PPO | Source: Ambulatory Visit | Attending: Obstetrics and Gynecology | Admitting: Obstetrics and Gynecology

## 2018-11-24 VITALS — BP 116/80 | Ht 68.0 in | Wt 225.8 lb

## 2018-11-24 DIAGNOSIS — Z124 Encounter for screening for malignant neoplasm of cervix: Secondary | ICD-10-CM | POA: Insufficient documentation

## 2018-11-24 DIAGNOSIS — B3731 Acute candidiasis of vulva and vagina: Secondary | ICD-10-CM

## 2018-11-24 DIAGNOSIS — B373 Candidiasis of vulva and vagina: Secondary | ICD-10-CM | POA: Diagnosis not present

## 2018-11-24 DIAGNOSIS — Z113 Encounter for screening for infections with a predominantly sexual mode of transmission: Secondary | ICD-10-CM | POA: Insufficient documentation

## 2018-11-24 DIAGNOSIS — R300 Dysuria: Secondary | ICD-10-CM | POA: Diagnosis not present

## 2018-11-24 LAB — POCT WET PREP WITH KOH
Clue Cells Wet Prep HPF POC: NEGATIVE
KOH Prep POC: NEGATIVE
Trichomonas, UA: NEGATIVE
Yeast Wet Prep HPF POC: POSITIVE

## 2018-11-24 LAB — POCT URINALYSIS DIPSTICK
Bilirubin, UA: NEGATIVE
Blood, UA: NEGATIVE
Glucose, UA: NEGATIVE
Ketones, UA: NEGATIVE
Leukocytes, UA: NEGATIVE
Nitrite, UA: NEGATIVE
Protein, UA: NEGATIVE
Spec Grav, UA: 1.02 (ref 1.010–1.025)
pH, UA: 6 (ref 5.0–8.0)

## 2018-11-24 MED ORDER — FLUCONAZOLE 150 MG PO TABS
150.0000 mg | ORAL_TABLET | Freq: Once | ORAL | 0 refills | Status: AC
Start: 2018-11-24 — End: 2018-11-24

## 2018-11-24 NOTE — Progress Notes (Signed)
Julia ProvostPatel, [name], MD   Chief Complaint  Patient presents with  . Vaginal Discharge    having itchiness/irritation, no odor x 2-3 days  . Urinary Tract Infection    burning sensation and frequency urinating, no blood in urine x 2-3 days    HPI:      [name] is a 23 y.o. G0P0000 who LMP was Patient's last menstrual period was 11/05/2018 (approximate)., presents today for increased vag d/c with irritation/dysuria, no odor, for several days. Also with frequency, without hematuria, LBP, pelvic pain, fevers. No meds to treat. Pt has been in wet bathing suit recently. No prior abx use. Hx of BV in past. Pt is sex active recently, no BC. Was on OCPs for dysmen and plans to restart them. No hx of STDs. Never had pap.    Past Medical History:  Diagnosis Date  . BV (bacterial vaginosis)     History reviewed. No pertinent surgical history.  Family History  Problem Relation Age of Onset  . Diabetes Mother   . Cancer Maternal Grandmother   . Hypertension Paternal Grandmother   . Diabetes Paternal Grandmother     Social History   Socioeconomic History  . Marital status: Single    Spouse name: Not on file  . Number of children: Not on file  . Years of education: Not on file  . Highest education level: Not on file  Occupational History  . Not on file  Social Needs  . Financial resource strain: Not on file  . Food insecurity    Worry: Not on file    Inability: Not on file  . Transportation needs    Medical: Not on file    Non-medical: Not on file  Tobacco Use  . Smoking status: Never Smoker  . Smokeless tobacco: Never Used  Substance and Sexual Activity  . Alcohol use: No  . Drug use: Never  . Sexual activity: Yes    Partners: Male, Female    Birth control/protection: None  Lifestyle  . Physical activity    Days per week: Not on file    Minutes per session: Not on file  . Stress: Not on file  Relationships  . Social Musicianconnections    Talks on phone: Not on  file    Gets together: Not on file    Attends religious service: Not on file    Active member of club or organization: Not on file    Attends meetings of clubs or organizations: Not on file    Relationship status: Not on file  . Intimate partner violence    Fear of current or ex partner: Not on file    Emotionally abused: Not on file    Physically abused: Not on file    Forced sexual activity: Not on file  Other Topics Concern  . Not on file  Social History Narrative  . Not on file    No outpatient medications prior to visit.   No facility-administered medications prior to visit.       ROS:  Review of Systems  Constitutional: Negative for fatigue, fever and unexpected weight change.  Respiratory: Negative for cough, shortness of breath and wheezing.   Cardiovascular: Negative for chest pain, palpitations and leg swelling.  Gastrointestinal: Negative for blood in stool, constipation, diarrhea, nausea and vomiting.  Endocrine: Negative for cold intolerance, heat intolerance and polyuria.  Genitourinary: Positive for dysuria, frequency and vaginal discharge. Negative for dyspareunia, flank pain, genital sores, hematuria, menstrual problem,  pelvic pain, urgency, vaginal bleeding and vaginal pain.  Musculoskeletal: Negative for back pain, joint swelling and myalgias.  Skin: Negative for rash.  Neurological: Negative for dizziness, syncope, light-headedness, numbness and headaches.  Hematological: Negative for adenopathy.  Psychiatric/Behavioral: Negative for agitation, confusion, sleep disturbance and suicidal ideas. The patient is not nervous/anxious.    BREAST: No symptoms   OBJECTIVE:   Vitals:  BP 116/80   Ht 5\' 8"  (1.727 m)   Wt 225 lb 12.8 oz (102.4 kg)   LMP 11/05/2018 (Approximate)   BMI 34.33 kg/m   Physical Exam Vitals signs reviewed.  Constitutional:      Appearance: She is well-developed.  Neck:     Musculoskeletal: Normal range of motion.  Pulmonary:      Effort: Pulmonary effort is normal.  Genitourinary:    Pubic Area: No rash.      Labia:        Right: No rash, tenderness or lesion.        Left: No rash, tenderness or lesion.      Vagina: Vaginal discharge present. No erythema or tenderness.     Cervix: Normal.     Uterus: Normal. Not enlarged and not tender.      Adnexa: Right adnexa normal and left adnexa normal.       Right: No mass or tenderness.         Left: No mass or tenderness.       Comments: ERYTHEMA EXT; NO LESIONS Musculoskeletal: Normal range of motion.  Skin:    General: Skin is warm and dry.  Neurological:     General: No focal deficit present.     Mental Status: She is alert and oriented to person, place, and time.     Cranial Nerves: No cranial nerve deficit.  Psychiatric:        Mood and Affect: Mood normal.        Behavior: Behavior normal.        Thought Content: Thought content normal.        Judgment: Judgment normal.     Results: Results for orders placed or performed in visit on 11/24/18 (from the past 24 hour(s))  POCT Wet Prep with KOH     Status: Abnormal   Collection Time: 11/24/18  2:19 PM  Result Value Ref Range   Trichomonas, UA Negative    Clue Cells Wet Prep HPF POC neg    Epithelial Wet Prep HPF POC     Yeast Wet Prep HPF POC pos    Bacteria Wet Prep HPF POC     RBC Wet Prep HPF POC     WBC Wet Prep HPF POC     KOH Prep POC Negative Negative  POCT Urinalysis Dipstick     Status: Normal   Collection Time: 11/24/18  2:19 PM  Result Value Ref Range   Color, UA yellow    Clarity, UA clear    Glucose, UA Negative Negative   Bilirubin, UA neg    Ketones, UA neg    Spec Grav, UA 1.020 1.010 - 1.025   Blood, UA neg    pH, UA 6.0 5.0 - 8.0   Protein, UA Negative Negative   Urobilinogen, UA     Nitrite, UA neg    Leukocytes, UA Negative Negative   Appearance     Odor       Assessment/Plan: Candidal vaginitis - Plan: POCT Wet Prep with KOH, fluconazole (DIFLUCAN) 150 MG  tablet, Pos sx/exam/wet prep.  Rx diflucan. F/u prn.   Dysuria - Plan: POCT Urinalysis Dipstick, Neg UA. Sx most likely from vaginitis. F/u prn.   Cervical cancer screening - Plan: Cytology - PAP,   Screening for STD (sexually transmitted disease) - Plan: Cytology - PAP,     Meds ordered this encounter  Medications  . fluconazole (DIFLUCAN) 150 MG tablet    Sig: Take 1 tablet (150 mg total) by mouth once for 1 dose.    Dispense:  1 tablet    Refill:  0    Order Specific Question:   Supervising Provider    Answer:   Nadara MustardHARRIS, ROBERT P [[id number]]      Return if symptoms worsen or fail to improve.   B. , PA-C 11/24/2018 2:23 PM

## 2018-11-24 NOTE — Patient Instructions (Signed)
I value your feedback and entrusting us with your care. If you get a [facility] patient survey, I would appreciate you taking the time to let us know about your experience today. Thank you! 

## 2018-11-26 LAB — CYTOLOGY - PAP
Chlamydia: NEGATIVE
Neisseria Gonorrhea: NEGATIVE

## 2018-12-07 DIAGNOSIS — Z20828 Contact with and (suspected) exposure to other viral communicable diseases: Secondary | ICD-10-CM | POA: Diagnosis not present

## 2019-04-29 DIAGNOSIS — Z20822 Contact with and (suspected) exposure to covid-19: Secondary | ICD-10-CM | POA: Diagnosis not present

## 2019-09-21 DIAGNOSIS — Z03818 Encounter for observation for suspected exposure to other biological agents ruled out: Secondary | ICD-10-CM | POA: Diagnosis not present

## 2019-09-21 DIAGNOSIS — Z20822 Contact with and (suspected) exposure to covid-19: Secondary | ICD-10-CM | POA: Diagnosis not present

## 2019-11-08 DIAGNOSIS — Z20822 Contact with and (suspected) exposure to covid-19: Secondary | ICD-10-CM | POA: Diagnosis not present

## 2019-12-23 DIAGNOSIS — H5213 Myopia, bilateral: Secondary | ICD-10-CM | POA: Diagnosis not present

## 2019-12-26 DIAGNOSIS — Z20822 Contact with and (suspected) exposure to covid-19: Secondary | ICD-10-CM | POA: Diagnosis not present

## 2020-04-03 DIAGNOSIS — U071 COVID-19: Secondary | ICD-10-CM | POA: Diagnosis not present

## 2020-04-06 DIAGNOSIS — Z20822 Contact with and (suspected) exposure to covid-19: Secondary | ICD-10-CM | POA: Diagnosis not present

## 2020-04-06 DIAGNOSIS — Z03818 Encounter for observation for suspected exposure to other biological agents ruled out: Secondary | ICD-10-CM | POA: Diagnosis not present

## 2020-04-07 DIAGNOSIS — Z03818 Encounter for observation for suspected exposure to other biological agents ruled out: Secondary | ICD-10-CM | POA: Diagnosis not present

## 2020-05-07 IMAGING — CT CT ABD-PELV W/ CM
2 of 4 series · 16 of 46 positions shown, 18 images · IV contrast (iopamidol)
Comparison: None.

CLINICAL DATA: Lower abdomen pain.

EXAM:
CT ABDOMEN AND PELVIS WITH CONTRAST
TECHNIQUE: Multidetector CT imaging of the abdomen and pelvis was performed
using the standard protocol following bolus administration of
intravenous contrast.
CONTRAST:  100mL 2OI0CG-0LL IOPAMIDOL (2OI0CG-0LL) INJECTION 61%

[Series 2: routine abd/pel with · axial · 0.69mm/px · z∈[-955,-510]mm · 13 of 99 slices shown, 15 images]
[im 5/99  soft-tissue]
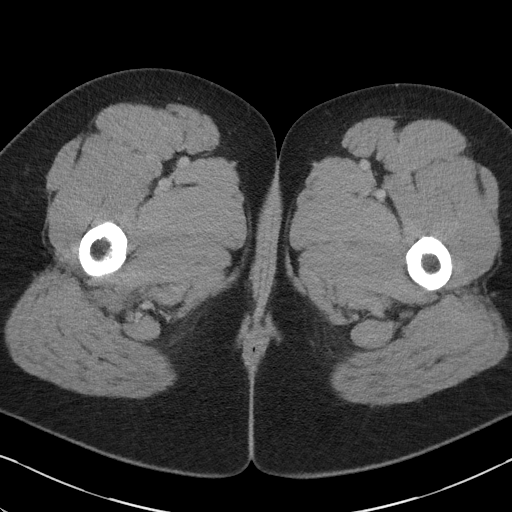
[im 5/99  bone]
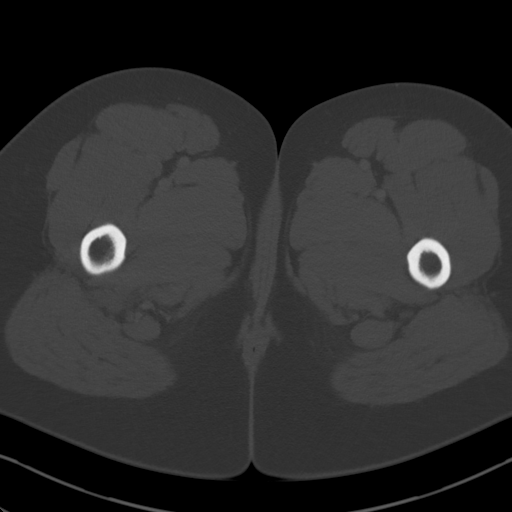
[im 13/99  soft-tissue]
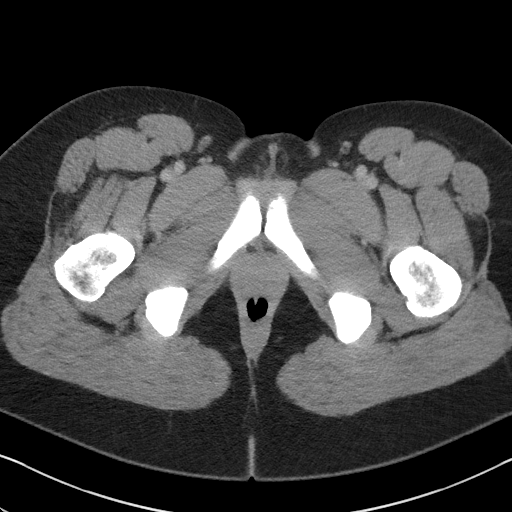
[im 21/99  soft-tissue]
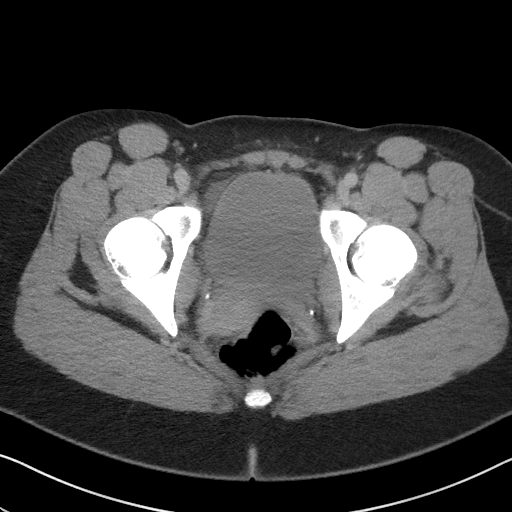
[im 29/99  soft-tissue]
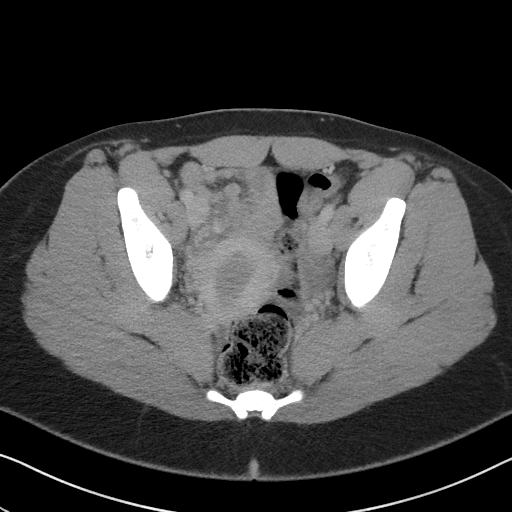
[im 33/99  soft-tissue]
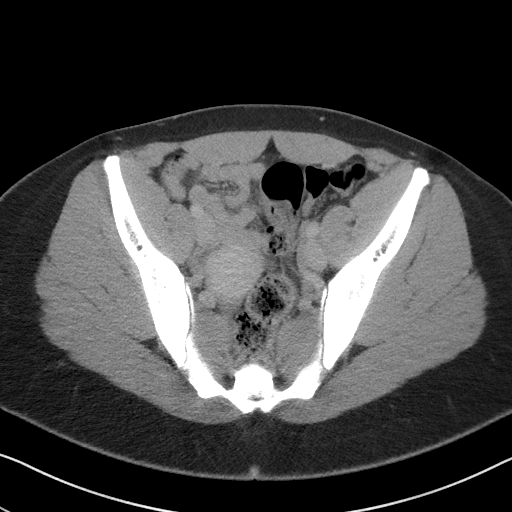
[im 41/99  soft-tissue]
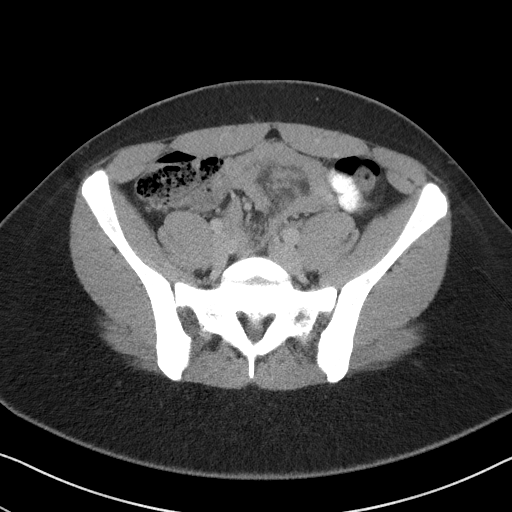
[im 50/99  soft-tissue]
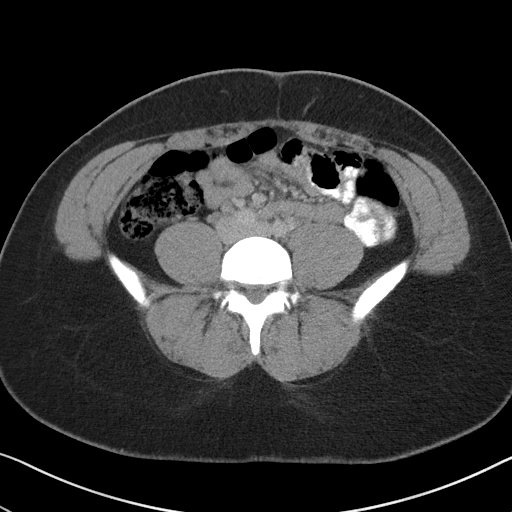
[im 58/99  soft-tissue]
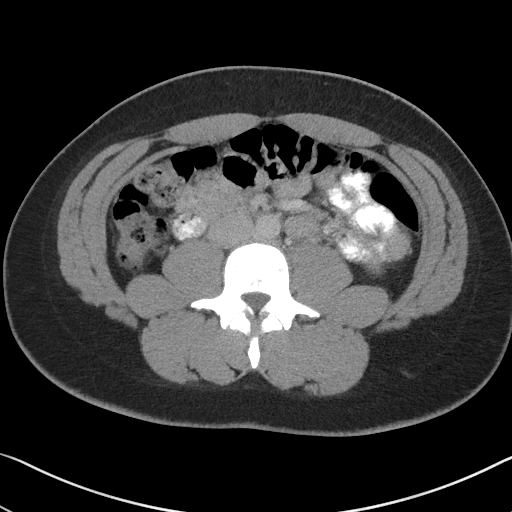
[im 66/99  soft-tissue]
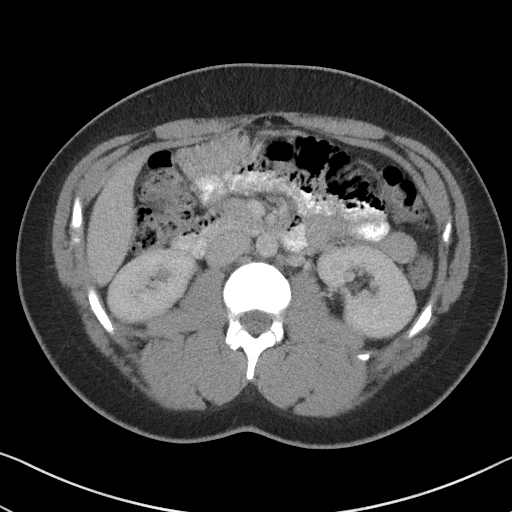
[im 66/99  bone]
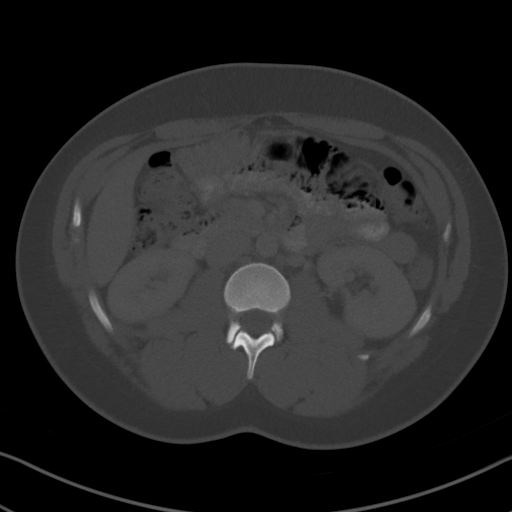
[im 70/99  soft-tissue]
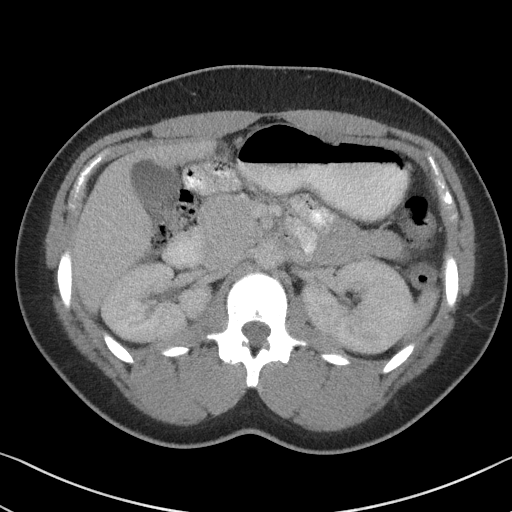
[im 78/99  soft-tissue]
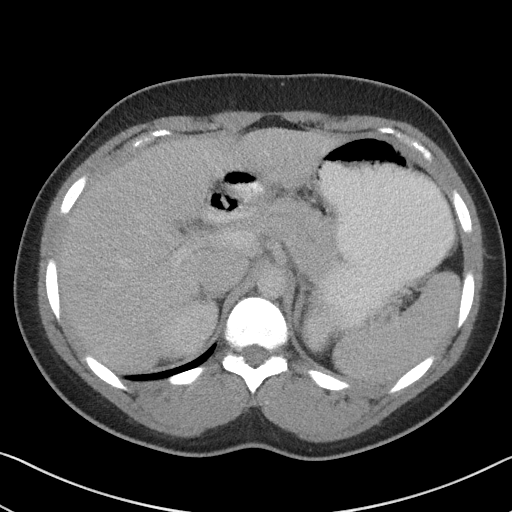
[im 86/99  soft-tissue]
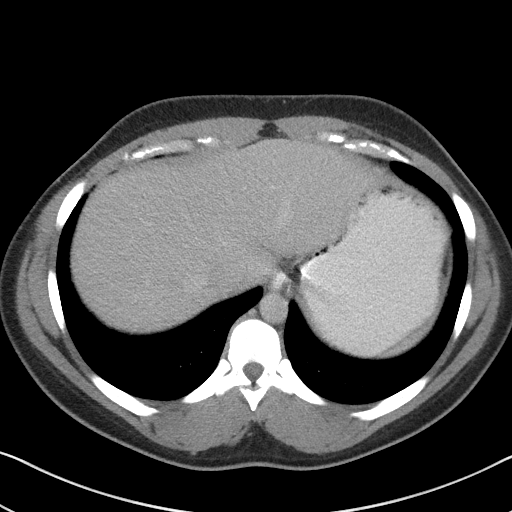
[im 94/99  soft-tissue]
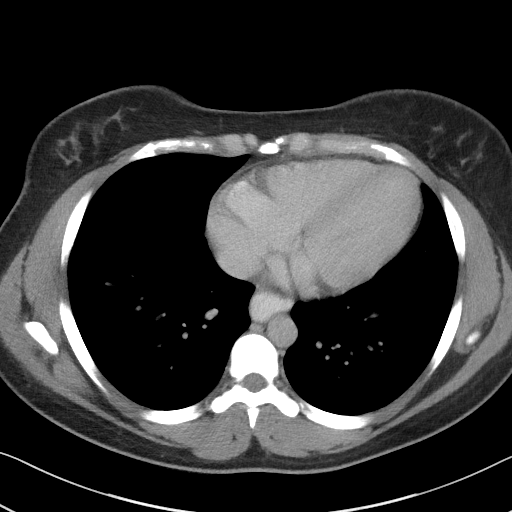

[Series 5: coronal st · coronal · 0.89mm/px · 3 of 97 slices shown]
[im 33/97  soft-tissue]
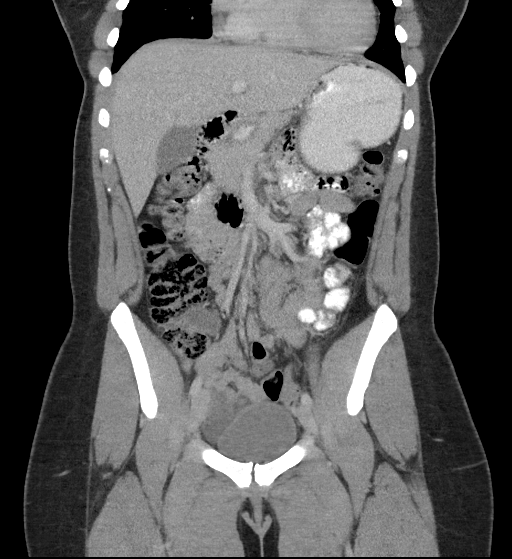
[im 43/97  soft-tissue]
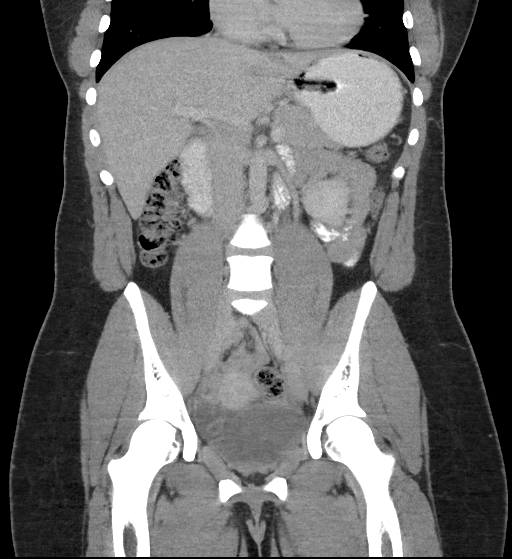
[im 54/97  soft-tissue]
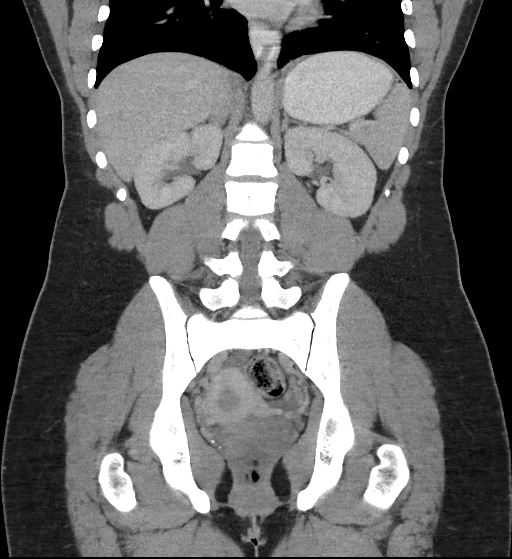

[16 of 46 positions shown; findings below may reference images not displayed]

FINDINGS: Lower chest: No acute abnormality.

Hepatobiliary: Diffuse low density of the liver. There is no focal
liver mass. The gallbladder and biliary tree are normal.

Pancreas: Unremarkable. No pancreatic ductal dilatation or
surrounding inflammatory changes.

Spleen: Normal in size without focal abnormality.

Adrenals/Urinary Tract: Adrenal glands are unremarkable. Kidneys are
normal, without renal calculi, focal lesion, or hydronephrosis.
Bladder is unremarkable.

Stomach/Bowel: There is a small hiatal hernia. The stomach is
otherwise normal. There is no small bowel obstruction. The appendix
is not seen but no inflammation is noted around cecum. Colon is
normal.

Vascular/Lymphatic: No significant vascular findings are present. No
enlarged abdominal or pelvic lymph nodes.

Reproductive: Mild free fluid is identified in the pelvis.
Complicated cystic area is identified in the right adnexa measuring
3.6 x 7.6 cm probably right ovary with multiple cysts. The uterus is
normal. Smaller cysts are identified in the left ovary.

Other: None.

Musculoskeletal: No acute or significant osseous findings.
IMPRESSION: No acute abnormality identified in the abdomen.

No bowel obstruction. The appendix is not seen but no inflammation
is noted around cecum.

Small amount of free fluid is identified in the pelvis. Bilateral
ovarian cysts, right greater than left, probably follicular cysts.

## 2023-03-19 ENCOUNTER — Ambulatory Visit: Payer: BC Managed Care – PPO | Admitting: Internal Medicine

## 2023-03-31 ENCOUNTER — Ambulatory Visit: Payer: BC Managed Care – PPO | Admitting: Family Medicine

## 2023-04-10 ENCOUNTER — Other Ambulatory Visit (HOSPITAL_COMMUNITY)
Admission: RE | Admit: 2023-04-10 | Discharge: 2023-04-10 | Disposition: A | Discharge status: Home / Self Care | Payer: 59 | Source: Ambulatory Visit | Attending: Family Medicine | Admitting: Family Medicine

## 2023-04-10 ENCOUNTER — Ambulatory Visit: Payer: 59 | Admitting: Family Medicine

## 2023-04-10 ENCOUNTER — Encounter: Payer: Self-pay | Admitting: Family Medicine

## 2023-04-10 VITALS — BP 110/80 | HR 83 | Temp 98.3°F | Ht 69.0 in | Wt 289.0 lb

## 2023-04-10 DIAGNOSIS — Z6841 Body Mass Index (BMI) 40.0 and over, adult: Secondary | ICD-10-CM

## 2023-04-10 DIAGNOSIS — Z7689 Persons encountering health services in other specified circumstances: Secondary | ICD-10-CM

## 2023-04-10 DIAGNOSIS — Z0000 Encounter for general adult medical examination without abnormal findings: Secondary | ICD-10-CM

## 2023-04-10 DIAGNOSIS — Z23 Encounter for immunization: Secondary | ICD-10-CM | POA: Diagnosis not present

## 2023-04-10 DIAGNOSIS — Z83438 Family history of other disorder of lipoprotein metabolism and other lipidemia: Secondary | ICD-10-CM

## 2023-04-10 DIAGNOSIS — Z1159 Encounter for screening for other viral diseases: Secondary | ICD-10-CM | POA: Diagnosis not present

## 2023-04-10 DIAGNOSIS — E66813 Obesity, class 3: Secondary | ICD-10-CM

## 2023-04-10 DIAGNOSIS — Z113 Encounter for screening for infections with a predominantly sexual mode of transmission: Secondary | ICD-10-CM

## 2023-04-10 DIAGNOSIS — Z124 Encounter for screening for malignant neoplasm of cervix: Secondary | ICD-10-CM | POA: Diagnosis present

## 2023-04-10 DIAGNOSIS — Z833 Family history of diabetes mellitus: Secondary | ICD-10-CM

## 2023-04-10 DIAGNOSIS — Z114 Encounter for screening for human immunodeficiency virus [HIV]: Secondary | ICD-10-CM

## 2023-04-10 MED ORDER — WEGOVY 0.25 MG/0.5ML ~~LOC~~ SOAJ: 0.2500 mg | SUBCUTANEOUS | 1 refills | Status: AC

## 2023-04-10 NOTE — Progress Notes (Signed)
 I,Jameka J Llittleton, CMA,acting as a neurosurgeon for [employer], NP.,have documented all relevant documentation on the behalf of Bruna Creighton, NP,as directed by  Bruna Creighton, NP while in the presence of Bruna Creighton, NP.  Subjective:  Patient ID: Julia Rogers , female    DOB: 10-22-1995 , 28 y.o.   MRN: [id number]  Chief Complaint  Patient presents with   Establish Care   Annual Exam   Weight Loss    HPI  Patient is a 28 year old female who presents today to establish care and to have her annual physical.. Patient is concerned and would like to discuss her weight.  Patient states that she she has been dieting for the past 6 months and more and also she has been exercising regularly but she has not lost any weight , so today patient would like discuss all her available options for weight management.    Patient will be getting her Pap smear done today also ; she has not had performed in awhile.     Past Medical History:  Diagnosis Date   BV (bacterial vaginosis)      Family History  Problem Relation Age of Onset   Diabetes Mother    Thyroid disease Mother    Cancer Maternal Grandmother    Breast cancer Maternal Grandmother    COPD Paternal Grandmother    Hypertension Paternal Grandmother    Diabetes Paternal Grandmother      Current Outpatient Medications:    Semaglutide -Weight Management (WEGOVY ) 0.25 MG/0.5ML SOAJ, Inject 0.25 mg into the skin every 7 (seven) days., Disp: 2 mL, Rfl: 1   No Known Allergies   Review of Systems  Constitutional: Negative.   HENT: Negative.    Eyes: Negative.   Respiratory: Negative.    Cardiovascular: Negative.   Gastrointestinal: Negative.   Endocrine: Negative.   Genitourinary: Negative.   Musculoskeletal: Negative.   Skin: Negative.   Allergic/Immunologic: Negative.   Neurological: Negative.   Hematological: Negative.   Psychiatric/Behavioral: Negative.       Today's Vitals   04/10/23 1524  BP: 110/80  Pulse: 83  Temp: 98.3  F (36.8 C)  TempSrc: Oral  Weight: 289 lb (131.1 kg)  Height: 5' 9 (1.753 m)  PainSc: 0-No pain   Body mass index is 42.68 kg/m.  Wt Readings from Last 3 Encounters:  04/10/23 289 lb (131.1 kg)  11/24/18 225 lb 12.8 oz (102.4 kg)  10/23/17 222 lb (100.7 kg)    The ASCVD Risk score (Arnett DK, et al., 2019) failed to calculate for the following reasons:   The 2019 ASCVD risk score is only valid for ages 36 to 38  Objective:  Physical Exam Exam conducted with a chaperone present [facility] CMA).  Constitutional:      Appearance: Normal appearance.  HENT:     Head: Normocephalic.  Cardiovascular:     Rate and Rhythm: Normal rate and regular rhythm.     Pulses: Normal pulses.     Heart sounds: Normal heart sounds.  Pulmonary:     Effort: Pulmonary effort is normal.     Breath sounds: Normal breath sounds.  Abdominal:     General: Bowel sounds are normal.     Hernia: There is no hernia in the left inguinal area or right inguinal area.  Genitourinary:    General: Normal vulva.     Exam position: Lithotomy position.     Pubic Area: No rash or pubic lice.      Labia:  Right: No rash, tenderness or lesion.        Left: No rash, tenderness or lesion.      Comments: No lesions, rash or injury noted to the external genitalia. Pap smear performed and well tolerated, no foul smell noted.Scant discharge. Musculoskeletal:        General: Normal range of motion.     Cervical back: Normal range of motion.  Skin:    General: Skin is warm and dry.  Neurological:     General: No focal deficit present.     Mental Status: She is alert and oriented to person, place, and time. Mental status is at baseline.  Psychiatric:        Mood and Affect: Mood normal.         Assessment And Plan:  Encounter for general adult medical examination w/o abnormal findings -     CBC -     CMP14+EGFR  Encounter for hepatitis C screening test for low risk patient -     Hepatitis C  antibody  Screening for HIV (human immunodeficiency virus) -     HIV Antibody (routine testing w rflx)  Immunization due -     Flu vaccine trivalent PF, 6mos and older(Flulaval,Afluria,Fluarix,Fluzone)  Screening for STD (sexually transmitted disease) -     RPR -     NuSwab Vaginitis Plus (VG+)  Cervical cancer screening -     Cytology - PAP  Family history of diabetes mellitus in first degree relative -     Hemoglobin A1c  Family history of elevated blood lipids -     Lipid panel  Class 3 severe obesity with serious comorbidity and body mass index (BMI) of 40.0 to 44.9 in adult, unspecified obesity type [facility]) Assessment & Plan: She is encouraged to strive for BMI less than 30 to decrease cardiac risk. Advised to aim for at least 150 minutes of exercise per week.   Orders: -     Wegovy ; Inject 0.25 mg into the skin every 7 (seven) days.  Dispense: 2 mL; Refill: 1    Return for 1 year physical, 2 months  Weight management.  Patient was given opportunity to ask questions. Patient verbalized understanding of the plan and was able to repeat key elements of the plan. All questions were answered to their satisfaction.   I, Bruna Creighton, NP, have reviewed all documentation for this visit. The documentation on 04/14/2023 for the exam, diagnosis, procedures, and orders are all accurate and complete.     IF YOU HAVE BEEN REFERRED TO A SPECIALIST, IT MAY TAKE 1-2 WEEKS TO SCHEDULE/PROCESS THE REFERRAL. IF YOU HAVE NOT HEARD FROM US /SPECIALIST IN TWO WEEKS, PLEASE GIVE US  A CALL AT [phone] X [phone].

## 2023-04-10 NOTE — Assessment & Plan Note (Signed)
 She is encouraged to strive for BMI less than 30 to decrease cardiac risk. Advised to aim for at least 150 minutes of exercise per week.

## 2023-04-11 LAB — CBC
Hematocrit: 39.8 % (ref 34.0–46.6)
Hemoglobin: 13.1 g/dL (ref 11.1–15.9)
MCH: 27.3 pg (ref 26.6–33.0)
MCHC: 32.9 g/dL (ref 31.5–35.7)
MCV: 83 fL (ref 79–97)
Platelets: 300 10*3/uL (ref 150–450)
RBC: 4.79 x10E6/uL (ref 3.77–5.28)
RDW: 13.2 % (ref 11.7–15.4)
WBC: 11.6 10*3/uL — ABNORMAL HIGH (ref 3.4–10.8)

## 2023-04-11 LAB — CMP14+EGFR
ALT: 12 [IU]/L (ref 0–32)
AST: 15 [IU]/L (ref 0–40)
Albumin: 4.6 g/dL (ref 4.0–5.0)
Alkaline Phosphatase: 56 [IU]/L (ref 44–121)
BUN/Creatinine Ratio: 12 (ref 9–23)
BUN: 10 mg/dL (ref 6–20)
Bilirubin Total: 0.7 mg/dL (ref 0.0–1.2)
CO2: 23 mmol/L (ref 20–29)
Calcium: 9.9 mg/dL (ref 8.7–10.2)
Chloride: 101 mmol/L (ref 96–106)
Creatinine, Ser: 0.86 mg/dL (ref 0.57–1.00)
Globulin, Total: 2.3 g/dL (ref 1.5–4.5)
Glucose: 84 mg/dL (ref 70–99)
Potassium: 4.2 mmol/L (ref 3.5–5.2)
Sodium: 139 mmol/L (ref 134–144)
Total Protein: 6.9 g/dL (ref 6.0–8.5)
eGFR: 94 mL/min/{1.73_m2} (ref >59)

## 2023-04-11 LAB — HEMOGLOBIN A1C
Est. average glucose Bld gHb Est-mCnc: 123 mg/dL
Hgb A1c MFr Bld: 5.9 % — ABNORMAL HIGH (ref 4.8–5.6)

## 2023-04-11 LAB — RPR: RPR Ser Ql: NONREACTIVE

## 2023-04-11 LAB — LIPID PANEL
Chol/HDL Ratio: 3.4 {ratio} (ref 0.0–4.4)
Cholesterol, Total: 195 mg/dL (ref 100–199)
HDL: 58 mg/dL (ref >39)
LDL Chol Calc (NIH): 120 mg/dL — ABNORMAL HIGH (ref 0–99)
Triglycerides: 95 mg/dL (ref 0–149)
VLDL Cholesterol Cal: 17 mg/dL (ref 5–40)

## 2023-04-11 LAB — HEPATITIS C ANTIBODY: Hep C Virus Ab: NONREACTIVE

## 2023-04-11 LAB — HIV ANTIBODY (ROUTINE TESTING W REFLEX): HIV Screen 4th Generation wRfx: NONREACTIVE

## 2023-04-12 LAB — NUSWAB VAGINITIS PLUS (VG+)
Candida albicans, NAA: NEGATIVE
Candida glabrata, NAA: NEGATIVE
Chlamydia trachomatis, NAA: NEGATIVE
Neisseria gonorrhoeae, NAA: NEGATIVE
Trich vag by NAA: NEGATIVE

## 2023-04-14 ENCOUNTER — Encounter: Payer: Self-pay | Admitting: Family Medicine

## 2023-04-14 DIAGNOSIS — Z833 Family history of diabetes mellitus: Secondary | ICD-10-CM | POA: Insufficient documentation

## 2023-04-14 DIAGNOSIS — Z83438 Family history of other disorder of lipoprotein metabolism and other lipidemia: Secondary | ICD-10-CM | POA: Insufficient documentation

## 2023-04-14 DIAGNOSIS — Z124 Encounter for screening for malignant neoplasm of cervix: Secondary | ICD-10-CM | POA: Insufficient documentation

## 2023-04-14 DIAGNOSIS — Z113 Encounter for screening for infections with a predominantly sexual mode of transmission: Secondary | ICD-10-CM | POA: Insufficient documentation

## 2023-04-14 DIAGNOSIS — Z7689 Persons encountering health services in other specified circumstances: Secondary | ICD-10-CM | POA: Insufficient documentation

## 2023-04-14 DIAGNOSIS — Z1159 Encounter for screening for other viral diseases: Secondary | ICD-10-CM | POA: Insufficient documentation

## 2023-04-14 DIAGNOSIS — Z23 Encounter for immunization: Secondary | ICD-10-CM | POA: Insufficient documentation

## 2023-04-14 DIAGNOSIS — Z114 Encounter for screening for human immunodeficiency virus [HIV]: Secondary | ICD-10-CM | POA: Insufficient documentation

## 2023-04-14 DIAGNOSIS — Z0000 Encounter for general adult medical examination without abnormal findings: Secondary | ICD-10-CM | POA: Insufficient documentation

## 2023-04-14 NOTE — Progress Notes (Signed)
 Pap smear/STD testing came back negative. Bad cholesterol (LDL) is elevated and A1c is 5.9; which is prediabetic. Low fat and low carb diet advised with exercise.  Thanks!

## 2023-04-16 LAB — CYTOLOGY - PAP
Chlamydia: NEGATIVE
Comment: NEGATIVE
Comment: NEGATIVE
Comment: NEGATIVE
Comment: NORMAL
Diagnosis: NEGATIVE
High risk HPV: NEGATIVE
Neisseria Gonorrhea: NEGATIVE
Trichomonas: NEGATIVE

## 2023-06-11 ENCOUNTER — Ambulatory Visit: Payer: 59 | Admitting: Family Medicine

## 2023-11-20 ENCOUNTER — Encounter: Payer: Self-pay | Admitting: Family Medicine

## 2023-12-19 ENCOUNTER — Other Ambulatory Visit: Payer: Self-pay | Admitting: Family Medicine
